# Patient Record
Sex: Male | Born: 1970 | Race: White | Hispanic: No | Smoking: Current every day smoker
Health system: Southern US, Community
[De-identification: ages and names within clinical notes are randomized; demographics above are authoritative.]

## PROBLEM LIST (undated history)

## (undated) DIAGNOSIS — K219 Gastro-esophageal reflux disease without esophagitis: Secondary | ICD-10-CM

## (undated) DIAGNOSIS — K4090 Unilateral inguinal hernia, without obstruction or gangrene, not specified as recurrent: Secondary | ICD-10-CM

## (undated) HISTORY — DX: Unilateral inguinal hernia, without obstruction or gangrene, not specified as recurrent: K40.90

---

## 2002-01-02 HISTORY — PX: ABDOMINAL SURGERY: SHX537

## 2004-01-11 ENCOUNTER — Emergency Department (HOSPITAL_COMMUNITY): Admission: EM | Admit: 2004-01-11 | Discharge: 2004-01-11 | Payer: Self-pay | Admitting: Emergency Medicine

## 2006-02-05 ENCOUNTER — Inpatient Hospital Stay (HOSPITAL_COMMUNITY): Admission: AC | Admit: 2006-02-05 | Discharge: 2006-02-10 | Payer: Self-pay

## 2006-02-18 ENCOUNTER — Emergency Department (HOSPITAL_COMMUNITY): Admission: EM | Admit: 2006-02-18 | Discharge: 2006-02-18 | Payer: Self-pay | Admitting: Emergency Medicine

## 2007-12-15 ENCOUNTER — Emergency Department (HOSPITAL_COMMUNITY): Admission: EM | Admit: 2007-12-15 | Discharge: 2007-12-15 | Payer: Self-pay | Admitting: Family Medicine

## 2008-01-27 ENCOUNTER — Emergency Department: Payer: Self-pay | Admitting: Emergency Medicine

## 2008-03-09 ENCOUNTER — Emergency Department: Payer: Self-pay | Admitting: Emergency Medicine

## 2008-09-29 ENCOUNTER — Emergency Department: Payer: Self-pay | Admitting: Emergency Medicine

## 2009-02-01 ENCOUNTER — Emergency Department (HOSPITAL_COMMUNITY): Admission: EM | Admit: 2009-02-01 | Discharge: 2009-02-01 | Payer: Self-pay | Admitting: Emergency Medicine

## 2009-04-03 ENCOUNTER — Emergency Department (HOSPITAL_COMMUNITY): Admission: EM | Admit: 2009-04-03 | Discharge: 2009-04-03 | Payer: Self-pay | Admitting: Emergency Medicine

## 2013-01-02 DIAGNOSIS — K4090 Unilateral inguinal hernia, without obstruction or gangrene, not specified as recurrent: Secondary | ICD-10-CM

## 2013-01-02 HISTORY — DX: Unilateral inguinal hernia, without obstruction or gangrene, not specified as recurrent: K40.90

## 2013-04-01 ENCOUNTER — Encounter: Payer: Self-pay | Admitting: General Surgery

## 2013-04-17 ENCOUNTER — Other Ambulatory Visit: Payer: Self-pay | Admitting: General Surgery

## 2013-04-17 ENCOUNTER — Ambulatory Visit (INDEPENDENT_AMBULATORY_CARE_PROVIDER_SITE_OTHER): Payer: BC Managed Care – PPO | Admitting: General Surgery

## 2013-04-17 ENCOUNTER — Encounter: Payer: Self-pay | Admitting: General Surgery

## 2013-04-17 VITALS — BP 128/78 | HR 60 | Resp 12 | Ht 74.0 in | Wt 154.0 lb

## 2013-04-17 DIAGNOSIS — K4090 Unilateral inguinal hernia, without obstruction or gangrene, not specified as recurrent: Secondary | ICD-10-CM
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
## 2013-04-17 NOTE — Patient Instructions (Addendum)

## 2013-05-13 ENCOUNTER — Ambulatory Visit: Payer: Self-pay | Admitting: General Surgery

## 2013-05-14 ENCOUNTER — Encounter: Payer: Self-pay | Admitting: General Surgery

## 2013-05-16 ENCOUNTER — Ambulatory Visit: Payer: Self-pay | Admitting: General Surgery

## 2013-05-16 ENCOUNTER — Encounter: Payer: Self-pay | Admitting: General Surgery

## 2013-05-16 DIAGNOSIS — K4090 Unilateral inguinal hernia, without obstruction or gangrene, not specified as recurrent: Secondary | ICD-10-CM

## 2013-05-16 HISTORY — PX: HERNIA REPAIR: SHX51

## 2013-05-19 ENCOUNTER — Telehealth: Payer: Self-pay | Admitting: *Deleted

## 2013-05-19 NOTE — Telephone Encounter (Signed)
New RX for Norco, 5/ 425, #30 w/ No refills provided.

## 2013-05-19 NOTE — Telephone Encounter (Signed)
New RX written. Family will have to pick up as it cannot be called to his pharmacy.

## 2013-05-19 NOTE — Telephone Encounter (Signed)
Pt had hernia repair on 05/16/13 and he is still in pain- you prescribed him Hydrocodone and he has 6 left, he wants to know what can he do when he runs out of the medicine, he was wondering if he could get a few more or take something else for pain.

## 2013-05-28 ENCOUNTER — Ambulatory Visit (INDEPENDENT_AMBULATORY_CARE_PROVIDER_SITE_OTHER): Payer: BC Managed Care – PPO | Admitting: General Surgery

## 2013-05-28 ENCOUNTER — Encounter: Payer: Self-pay | Admitting: General Surgery

## 2013-05-28 VITALS — BP 110/70 | HR 84 | Resp 14 | Ht 74.0 in | Wt 151.0 lb

## 2013-05-28 DIAGNOSIS — K4090 Unilateral inguinal hernia, without obstruction or gangrene, not specified as recurrent: Secondary | ICD-10-CM
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
## 2013-05-28 NOTE — Patient Instructions (Signed)
Patient to return in one month. 

## 2013-06-18 ENCOUNTER — Telehealth: Payer: Self-pay | Admitting: *Deleted

## 2013-06-18 NOTE — Telephone Encounter (Signed)
Pt called and had inguinal hernia surgery on 05/16/13 and now has pain in the main vein that goes through scrotum, he was wondering what should he do, does he need to be seen?

## 2013-06-18 NOTE — Telephone Encounter (Signed)
I talked with mom(pt at work and mobile # not available). She is aware for him to try heat and/or ice for comfort. Also try 2 Aleve BID and to call back Monday with status report. Appointment is for 07-01-13, mom agrees she will tell patient.

## 2013-06-24 ENCOUNTER — Telehealth: Payer: Self-pay | Admitting: *Deleted

## 2013-06-24 NOTE — Telephone Encounter (Signed)
States he is using heat in the evenings and the heat does help. States it is constant "squeezing" sensation, right scrotum. Tried Aleve and Advil. Appointment is 07-01-13.

## 2013-06-24 NOTE — Telephone Encounter (Signed)
Notified patient as instructed.   

## 2013-06-24 NOTE — Telephone Encounter (Signed)
Pt called and is still having pain in his scrotum, he describes it as a squeezing feeling and being drawed up.

## 2013-06-24 NOTE — Telephone Encounter (Signed)
Please make sure he is using jockey shorts or another form of scrotal support. Thanks. F/U as scheduled /6/30.

## 2013-07-01 ENCOUNTER — Ambulatory Visit (INDEPENDENT_AMBULATORY_CARE_PROVIDER_SITE_OTHER): Payer: Self-pay | Admitting: General Surgery

## 2013-07-01 ENCOUNTER — Encounter: Payer: Self-pay | Admitting: General Surgery

## 2013-07-01 VITALS — BP 130/78 | HR 72 | Resp 12 | Ht 74.0 in | Wt 154.0 lb

## 2013-07-01 DIAGNOSIS — K4090 Unilateral inguinal hernia, without obstruction or gangrene, not specified as recurrent: Secondary | ICD-10-CM

## 2013-07-01 DIAGNOSIS — K469 Unspecified abdominal hernia without obstruction or gangrene: Secondary | ICD-10-CM | POA: Insufficient documentation

## 2013-07-01 MED ORDER — DOXYCYCLINE HYCLATE 100 MG PO CAPS
100.0000 mg | ORAL_CAPSULE | Freq: Two times a day (BID) | ORAL | Status: DC
Start: 2013-07-01 — End: 2013-07-22
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
## 2013-07-01 NOTE — Patient Instructions (Signed)
The patient is aware to call back for any questions or concerns.  

## 2013-07-22 ENCOUNTER — Ambulatory Visit (INDEPENDENT_AMBULATORY_CARE_PROVIDER_SITE_OTHER): Payer: BC Managed Care – PPO | Admitting: General Surgery

## 2013-07-22 ENCOUNTER — Other Ambulatory Visit: Payer: BC Managed Care – PPO

## 2013-07-22 ENCOUNTER — Encounter: Payer: Self-pay | Admitting: General Surgery

## 2013-07-22 VITALS — BP 104/74 | HR 76 | Resp 12 | Ht 74.0 in | Wt 153.0 lb

## 2013-07-22 DIAGNOSIS — K4190 Unilateral femoral hernia, without obstruction or gangrene, not specified as recurrent: Secondary | ICD-10-CM

## 2013-07-22 DIAGNOSIS — R1031 Right lower quadrant pain: Secondary | ICD-10-CM

## 2013-07-22 NOTE — Patient Instructions (Signed)
The patient is aware to call back for any questions or concerns.  
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
## 2013-07-23 ENCOUNTER — Encounter: Payer: Self-pay | Admitting: *Deleted

## 2013-07-23 NOTE — Progress Notes (Signed)
Patient ID: [name], male   DOB: 07/13/1970, 43 y.o.   MRN: [id number]  Patient's surgery has been scheduled for 08-14-13 at [facility].

## 2013-08-05 ENCOUNTER — Other Ambulatory Visit: Payer: Self-pay | Admitting: General Surgery

## 2013-08-05 DIAGNOSIS — R1031 Right lower quadrant pain: Secondary | ICD-10-CM

## 2013-08-05 DIAGNOSIS — R1030 Lower abdominal pain, unspecified: Secondary | ICD-10-CM | POA: Insufficient documentation

## 2013-08-05 DIAGNOSIS — K4190 Unilateral femoral hernia, without obstruction or gangrene, not specified as recurrent: Secondary | ICD-10-CM

## 2013-08-14 ENCOUNTER — Ambulatory Visit: Payer: Self-pay | Admitting: General Surgery

## 2013-08-14 DIAGNOSIS — K4190 Unilateral femoral hernia, without obstruction or gangrene, not specified as recurrent: Secondary | ICD-10-CM

## 2013-08-14 HISTORY — PX: HERNIA REPAIR: SHX51

## 2013-08-18 ENCOUNTER — Telehealth: Payer: Self-pay | Admitting: *Deleted

## 2013-08-18 ENCOUNTER — Encounter: Payer: Self-pay | Admitting: General Surgery

## 2013-08-18 NOTE — Progress Notes (Signed)
Patient ID: Terrence DupontKenneth E Okun Jr., male   DOB: 02/01/1970, 43 y.o.   MRN: [id number]  Patient with marked pain after repair of second right groin hernia (femoral on second surgery, inguinal on first). Ilio-inguinal nerve not identified in OR. Will renew Percocet (#30, one po q4h prn pain, no refill) and start Neurontin, 300 mg tab; 1 po qd x 1, 1 po BID x 1 then 1 po TID.

## 2013-08-18 NOTE — Telephone Encounter (Signed)
Patient called the office to state that he only has 6 or 7 pain pills left and needs a refill. He is taking oxycodone 5-325 mg one or two every 4 hours for pain. He had hernia repair completed on 08-14-13. Patient scheduled for follow up appointment on 08-25-13.  The patient also states that you were to clip a nerve at time of surgery and that the area should feel numb. Patient reports the area is not numb and wants to know if this is because he is only 4 days post op.  Patient also states that the area is swollen and wants to know if this is normal. Patient reassured that it is.

## 2013-08-19 ENCOUNTER — Encounter: Payer: Self-pay | Admitting: General Surgery

## 2013-08-25 ENCOUNTER — Ambulatory Visit (INDEPENDENT_AMBULATORY_CARE_PROVIDER_SITE_OTHER): Payer: Self-pay | Admitting: General Surgery

## 2013-08-25 ENCOUNTER — Encounter: Payer: Self-pay | Admitting: General Surgery

## 2013-08-25 ENCOUNTER — Ambulatory Visit: Payer: BC Managed Care – PPO | Admitting: General Surgery

## 2013-08-25 VITALS — BP 100/74 | HR 80 | Resp 12 | Ht 74.0 in | Wt 149.0 lb

## 2013-08-25 DIAGNOSIS — K4190 Unilateral femoral hernia, without obstruction or gangrene, not specified as recurrent: Secondary | ICD-10-CM

## 2013-08-25 NOTE — Patient Instructions (Signed)
Patient advised of lifting restrictions. Patient to return in 2 weeks for follow up. The patient is aware to call back for any questions or concerns.
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
## 2013-08-28 ENCOUNTER — Telehealth: Payer: Self-pay | Admitting: *Deleted

## 2013-08-28 NOTE — Telephone Encounter (Signed)
Pt called and was wondering if he could go back to work on Monday on light duty with no lifting. Had hernia surgery on 08/14/13

## 2013-09-01 NOTE — Telephone Encounter (Signed)
OK to return to work, no heavy lifting.

## 2013-09-02 ENCOUNTER — Encounter: Payer: Self-pay | Admitting: General Surgery

## 2013-09-02 ENCOUNTER — Ambulatory Visit (INDEPENDENT_AMBULATORY_CARE_PROVIDER_SITE_OTHER): Payer: Self-pay | Admitting: General Surgery

## 2013-09-02 VITALS — BP 120/74 | HR 76 | Resp 12 | Ht 74.0 in | Wt 149.0 lb

## 2013-09-02 DIAGNOSIS — N508 Other specified disorders of male genital organs: Secondary | ICD-10-CM

## 2013-09-02 DIAGNOSIS — N5089 Other specified disorders of the male genital organs: Secondary | ICD-10-CM

## 2013-09-02 DIAGNOSIS — K4190 Unilateral femoral hernia, without obstruction or gangrene, not specified as recurrent: Secondary | ICD-10-CM

## 2013-09-02 NOTE — Patient Instructions (Signed)
Patient to return to work on 09/03/13 with light duty . Return in one month.
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
## 2013-09-02 NOTE — Telephone Encounter (Signed)
appt 09-02-13.

## 2013-09-03 DIAGNOSIS — N5089 Other specified disorders of the male genital organs: Secondary | ICD-10-CM | POA: Insufficient documentation

## 2013-09-10 ENCOUNTER — Ambulatory Visit: Payer: BC Managed Care – PPO | Admitting: General Surgery

## 2013-10-07 ENCOUNTER — Encounter: Payer: Self-pay | Admitting: General Surgery

## 2013-10-07 ENCOUNTER — Ambulatory Visit (INDEPENDENT_AMBULATORY_CARE_PROVIDER_SITE_OTHER): Payer: Self-pay | Admitting: General Surgery

## 2013-10-07 VITALS — BP 112/74 | HR 78 | Resp 12 | Ht 74.0 in | Wt 163.0 lb

## 2013-10-07 DIAGNOSIS — K4090 Unilateral inguinal hernia, without obstruction or gangrene, not specified as recurrent: Secondary | ICD-10-CM

## 2013-10-07 DIAGNOSIS — K4190 Unilateral femoral hernia, without obstruction or gangrene, not specified as recurrent: Secondary | ICD-10-CM
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
## 2013-10-07 NOTE — Patient Instructions (Addendum)
Patient may take two  Aleve twice  times daily. Patient to call in one month. Patient may use hot or ice as needed.

## 2013-10-08 ENCOUNTER — Encounter: Payer: Self-pay | Admitting: General Surgery

## 2014-04-25 NOTE — Op Note (Signed)
PATIENT NAME:  Ross Lyons, Ross E MR#:  [id number] DATE OF BIRTH:  Jun 01, 1970  DATE OF PROCEDURE:  05/16/2013  PREOPERATIVE DIAGNOSIS:  Right inguinal hernia.   POSTOPERATIVE DIAGNOSIS:  Right inguinal hernia, direct.   OPERATIVE PROCEDURE:  Right inguinal hernia repair with Phasix plug and patch.    SURGEON:  Donnalee CurryJeffrey Bion Todorov, M.D.   ANESTHESIA:  General endotracheal under Dr. Noralyn Pickarroll.   ESTIMATED BLOOD LOSS:  Less than 5 mL.   CLINICAL NOTE:  This 44 year old male has developed a symptomatic right inguinal hernia. He was admitted for elective repair. He received Kefzol prior to the procedure. Hair had been removed from the surgical site prior to presentation to the operating room.   PROCEDURE IN DETAIL:  With the patient under adequate general endotracheal anesthesia, the abdomen was prepped with ChloraPrep and draped. Marcaine was infiltrated for a field block anesthesia making use of 30 mL of 0.5% Marcaine with 1:200,000 units of epinephrine. A 5 cm skin line incision along the course of the inguinal canal was carried down through the skin and scant layer of adipose tissue. Hemostasis was with electrocautery and 3-0 Vicryl ties. The external oblique was opened in the direction of its fibers. The cord was mobilized. Dissection at the level of the internal ring showed no evidence of an indirect defect. There was marked weakening of the medial floor of the canal consistent with a direct hernia. The area below the transverse abdominis aponeurosis fascia was cleared and a large Phasix plug was placed and stitched circumferentially with 2-0 Monocryl. The external component of absorbable mesh was then placed along the floor of the inguinal canal. This was anchored to the pubic tubercle with similar 2-0 Monocryl and then interrupted sutures of Monocryl or 0 Surgilon to anchor the mesh into place. Anchoring was completed to the inguinal ligament and to the transverse abdominis aponeurosis. A lateral slit was  made for cord passage. The external oblique was closed with a running 2-0 Vicryl after instillation of 30 mg of Toradol. Scarpa fascia was closed with running 3-0 Vicryl and the skin closed with running 4-0 Vicryl subcuticular suture. Benzoin, Steri-Strips, Telfa and Tegaderm dressings were then applied.   The patient tolerated the procedure well and was taken to the recovery room in stable condition.     ____________________________ Earline MayotteJeffrey W. Geremiah Fussell, MD jwb:dmm D: 05/16/2013 08:49:35 ET T: 05/16/2013 10:12:40 ET JOB#: [id number]  cc: Earline MayotteJeffrey W. Trevor Wilkie, MD, <Dictator> Demetrios Isaacsonald E. Sherrie MustacheFisher, MD Luma Clopper Brion AlimentW Yaron Grasse MD ELECTRONICALLY SIGNED 05/18/2013 11:06

## 2014-04-25 NOTE — Op Note (Signed)
PATIENT NAME:  Ross Lyons, Ross Lyons MR#:  [id number] DATE OF BIRTH:  [birth year]  DATE OF PROCEDURE:  08/14/2013  PREOPERATIVE DIAGNOSES: Right femoral hernia, right testicular pain.    POSTOPERATIVE DIAGNOSES: Right femoral hernia, right testicular pain.   OPERATIVE PROCEDURE: Repair of right femoral hernia, exploration of right groin.   SURGEON: Dr. Donnalee CurryJeffrey Byrnett.   ANESTHESIA: General by LMA under Dr. Noralyn Pickarroll, Marcaine 0.5% with 1:200,000 units epinephrine, Toradol 30 mg.   CLINICAL NOTE: This 44 year old male underwent repair of a right direct inguinal hernia 3 months ago. He has had pain in the area of the superior aspect of the testicle and in the last few weeks has developed a bulge inferior to the inguinal ligament. Ultrasound confirmed a femoral hernia. He is planned for repair of his femoral hernia and possible ligation of the ilioinguinal nerve for control of his testicular pain.   OPERATIVE NOTE: With the patient under adequate general anesthesia, the groin was prepped with ChloraPrep and draped. The old incision was opened and extending slightly 1 cm laterally. The skin was incised sharply and the remaining dissection completed with electrocautery. There was noted to be dense scarring at the level of the external oblique. The external oblique was cleared below the inguinal ligament and the area medial to the femoral vein exposed and a 1 cm fascial defect at the site of the femoral hernia was exposed. A medium Atrium plug was planned to be used and was a little large for the defect. Two wedge shaped wings were removed from the outer surface of the plug and then it easily was intrduced  into the defect. It was anchored into position with 0 Surgilon sutures from the deep surface of the pubis to the inguinal ligament.   Attention was then turned towards the groin and the external oblique was opened lateral to the original incision. Fairly dense scarring was evident here. The dissection was  carried laterally just outside the area of the anterior/superior iliac spine. The ilioinguinal nerve and the iliohypogastric nerve could not be identified. It was elected not to disrupt the originally placed repair from his direct hernia. Of note, there was a nerve swinging around the protruding femoral hernia site running transversely. This nerve was preserved during the femoral hernia repair.   The external oblique was closed with a running 2-0 Vicryl. Scarpa's fascia was closed with a running 3-0 Vicryl, and the skin closed with a running 4-0 Vicryl subcuticular suture. Benzoin, Steri-Strips, Telfa and Tegaderm dressings were then applied.   The patient tolerated the procedure well and was taken to the recovery room in stable condition.    ____________________________ Earline MayotteJeffrey W. Byrnett, MD jwb:JT D: 08/14/2013 22:22:44 ET T: 08/14/2013 23:12:28 ET JOB#: [id number]  cc: Earline MayotteJeffrey W. Byrnett, MD, <Dictator> [name]. Sherrie MustacheFisher, MD JEFFREY Brion AlimentW BYRNETT MD ELECTRONICALLY SIGNED 08/15/2013 16:37

## 2016-10-10 ENCOUNTER — Ambulatory Visit: Payer: Self-pay | Admitting: Family Medicine

## 2016-10-13 ENCOUNTER — Encounter: Payer: Self-pay | Admitting: *Deleted

## 2016-10-13 ENCOUNTER — Ambulatory Visit: Payer: Self-pay | Admitting: Family Medicine

## 2016-10-18 ENCOUNTER — Inpatient Hospital Stay: Payer: Self-pay

## 2016-10-18 ENCOUNTER — Ambulatory Visit (INDEPENDENT_AMBULATORY_CARE_PROVIDER_SITE_OTHER): Payer: BLUE CROSS/BLUE SHIELD | Admitting: General Surgery

## 2016-10-18 ENCOUNTER — Encounter: Payer: Self-pay | Admitting: General Surgery

## 2016-10-18 VITALS — BP 100/68 | HR 74 | Resp 11 | Ht 73.0 in | Wt 140.0 lb

## 2016-10-18 DIAGNOSIS — K4191 Unilateral femoral hernia, without obstruction or gangrene, recurrent: Secondary | ICD-10-CM | POA: Diagnosis not present

## 2016-10-18 DIAGNOSIS — R1909 Other intra-abdominal and pelvic swelling, mass and lump: Secondary | ICD-10-CM
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
## 2016-10-18 NOTE — Patient Instructions (Signed)
The patient is aware to call back for any questions or concerns.  Inguinal Hernia, Adult An inguinal hernia is when fat or the intestines push through the area where the leg meets the lower belly (groin) and make a rounded lump (bulge). This condition happens over time. There are three types of inguinal hernias. These types include:  Hernias that can be pushed back into the belly (are reducible).  Hernias that cannot be pushed back into the belly (are incarcerated).  Hernias that cannot be pushed back into the belly and lose their blood supply (get strangulated). This type needs emergency surgery.  Follow these instructions at home: Lifestyle  Drink enough fluid to keep your urine (pee) clear or pale yellow.  Eat plenty of fruits, vegetables, and whole grains. These have a lot of fiber. Talk with your doctor if you have questions.  Avoid lifting heavy objects.  Avoid standing for long periods of time.  Do not use tobacco products. These include cigarettes, chewing tobacco, or e-cigarettes. If you need help quitting, ask your doctor.  Try to stay at a healthy weight. General instructions  Do not try to force the hernia back in.  Watch your hernia for any changes in color or size. Let your doctor know if there are any changes.  Take over-the-counter and prescription medicines only as told by your doctor.  Keep all follow-up visits as told by your doctor. This is important. Contact a doctor if:  You have a fever.  You have new symptoms.  Your symptoms get worse. Get help right away if:  The area where the legs meets the lower belly has: ? Pain that gets worse suddenly. ? A bulge that gets bigger suddenly and does not go down. ? A bulge that turns red or purple. ? A bulge that is painful to the touch.  You are a man and your scrotum: ? Suddenly feels painful. ? Suddenly changes in size.  You feel sick to your stomach (nauseous) and this feeling does not go  away.  You throw up (vomit) and this keeps happening.  You feel your heart beating a lot more quickly than normal.  You cannot poop (have a bowel movement) or pass gas. This information is not intended to replace advice given to you by your health care provider. Make sure you discuss any questions you have with your health care provider. Document Released: 01/19/2006 Document Revised: 05/27/2015 Document Reviewed: 10/29/2013 Elsevier Interactive Patient Education  2018 [employer] Inc[employer]  

## 2016-11-01 ENCOUNTER — Encounter
Admission: RE | Admit: 2016-11-01 | Discharge: 2016-11-01 | Disposition: A | Discharge status: Home / Self Care | Payer: Self-pay | Source: Ambulatory Visit | Attending: General Surgery | Admitting: General Surgery

## 2016-11-01 HISTORY — DX: Gastro-esophageal reflux disease without esophagitis: K21.9

## 2016-11-01 NOTE — Patient Instructions (Signed)
Your procedure is scheduled on: 11-08-16 Report to Same Day Surgery 2nd floor medical mall [facility](Medical Mall Entrance-take elevator on left to 2nd floor.  Check in with surgery information desk.) To find out your arrival time please call [phone] between 1PM - 3PM on 11-07-16  Remember: Instructions that are not followed completely may result in serious medical risk, up to and including death, or upon the discretion of your surgeon and anesthesiologist your surgery may need to be rescheduled.    _x___ 1. Do not eat food after midnight the night before your procedure. You may drink clear liquids up to 2 hours before you are scheduled to arrive at the hospital for your procedure.  Do not drink clear liquids within 2 hours of your scheduled arrival to the hospital.  Clear liquids include  --Water or Apple juice without pulp  --Clear carbohydrate beverage such as ClearFast or Gatorade  --[place] Coffee or Clear Tea (No milk, no creamers, do not add anything to  the coffee or Tea Type 1 and type 2 diabetics should only drink water.  No gum chewing or hard candies.     __x__ 2. No Alcohol for 24 hours before or after surgery.   __x__3. No Smoking for 24 prior to surgery.   ____  4. Bring all medications with you on the day of surgery if instructed.    __x__ 5. Notify your doctor if there is any change in your medical condition     (cold, fever, infections).     Do not wear jewelry, make-up, hairpins, clips or nail polish.  Do not wear lotions, powders, or perfumes. You may wear deodorant.  Do not shave 48 hours prior to surgery. Men may shave face and neck.  Do not bring valuables to the hospital.    [facility] any belongings or valuables.               Contacts, dentures or bridgework may not be worn into surgery.  Leave your suitcase in the car. After surgery it may be brought to your room.  For patients admitted to the hospital, discharge time is determined by  your treatment team.   Patients discharged the day of surgery will not be allowed to drive home.  You will need someone to drive you home and stay with you the night of your procedure.    Please read over the following fact sheets that you were given:     ____ Take anti-hypertensive listed below, cardiac, seizure, asthma,     anti-reflux and psychiatric medicines. These include:  1.NONE   2.  3.  4.  5.  6.  ____Fleets enema or Magnesium Citrate as directed.   ____ Use CHG Soap or sage wipes as directed on instruction sheet   ____ Use inhalers on the day of surgery and bring to hospital day of surgery  ____ Stop Metformin and Janumet 2 days prior to surgery.    ____ Take 1/2 of usual insulin dose the night before surgery and none on the morning     surgery.   ____ Follow recommendations from Cardiologist, Pulmonologist or PCP regarding          stopping Aspirin, Coumadin, Plavix ,Eliquis, Effient, or Pradaxa, and Pletal.  X____Stop Anti-inflammatories such as Advil, Aleve, Ibuprofen, Motrin, Naproxen, Naprosyn, Goodies powders, EXCEDRIN MIGRAINE NOW or aspirin products NOW-OK toCONTINUE ALEVE IF NEEDED   ____ Stop supplements until after surgery.    ____ Bring C-Pap  to the hospital.

## 2016-11-08 ENCOUNTER — Encounter: Payer: Self-pay | Admitting: *Deleted

## 2016-11-08 ENCOUNTER — Ambulatory Visit: Payer: Self-pay | Admitting: Anesthesiology

## 2016-11-08 ENCOUNTER — Encounter
Admission: RE | Disposition: A | Discharge status: Home / Self Care | Payer: Self-pay | Source: Ambulatory Visit | Attending: General Surgery

## 2016-11-08 ENCOUNTER — Ambulatory Visit
Admission: RE | Admit: 2016-11-08 | Discharge: 2016-11-08 | Disposition: A | Discharge status: Home / Self Care | Payer: Self-pay | Source: Ambulatory Visit | Attending: General Surgery | Admitting: General Surgery

## 2016-11-08 DIAGNOSIS — K4191 Unilateral femoral hernia, without obstruction or gangrene, recurrent: Secondary | ICD-10-CM | POA: Diagnosis not present

## 2016-11-08 DIAGNOSIS — F17210 Nicotine dependence, cigarettes, uncomplicated: Secondary | ICD-10-CM | POA: Insufficient documentation

## 2016-11-08 DIAGNOSIS — Z7982 Long term (current) use of aspirin: Secondary | ICD-10-CM | POA: Insufficient documentation

## 2016-11-08 HISTORY — PX: FEMORAL HERNIA REPAIR: SHX632

## 2016-11-08 SURGERY — HERNIA REPAIR FEMORAL
Anesthesia: General | Laterality: Right | Wound class: Clean

## 2016-11-08 MED ORDER — ACETAMINOPHEN 10 MG/ML IV SOLN
INTRAVENOUS | Status: AC
  Filled 2016-11-08: qty 100

## 2016-11-08 MED ORDER — KETOROLAC TROMETHAMINE 30 MG/ML IJ SOLN
INTRAMUSCULAR | Status: DC | PRN
  Administered 2016-11-08: 30 mg via INTRAVENOUS

## 2016-11-08 MED ORDER — FENTANYL CITRATE (PF) 100 MCG/2ML IJ SOLN
25.0000 ug | INTRAMUSCULAR | Status: AC | PRN
  Administered 2016-11-08 (×6): 25 ug via INTRAVENOUS

## 2016-11-08 MED ORDER — PROPOFOL 10 MG/ML IV BOLUS
INTRAVENOUS | Status: DC | PRN
  Administered 2016-11-08: 200 mg via INTRAVENOUS

## 2016-11-08 MED ORDER — GLYCOPYRROLATE 0.2 MG/ML IJ SOLN
INTRAMUSCULAR | Status: DC | PRN
  Administered 2016-11-08: 0.2 mg via INTRAVENOUS

## 2016-11-08 MED ORDER — HYDROCODONE-ACETAMINOPHEN 5-325 MG PO TABS
ORAL_TABLET | ORAL | Status: AC
  Filled 2016-11-08: qty 1

## 2016-11-08 MED ORDER — HYDROMORPHONE HCL 1 MG/ML IJ SOLN
0.5000 mg | INTRAMUSCULAR | Status: AC | PRN
  Administered 2016-11-08 (×4): 0.5 mg via INTRAVENOUS

## 2016-11-08 MED ORDER — FAMOTIDINE 20 MG PO TABS
20.0000 mg | ORAL_TABLET | Freq: Once | ORAL | Status: AC
  Administered 2016-11-08: 20 mg via ORAL

## 2016-11-08 MED ORDER — ACETAMINOPHEN 10 MG/ML IV SOLN
INTRAVENOUS | Status: DC | PRN
  Administered 2016-11-08: 1000 mg via INTRAVENOUS

## 2016-11-08 MED ORDER — MIDAZOLAM HCL 2 MG/2ML IJ SOLN
INTRAMUSCULAR | Status: AC
  Filled 2016-11-08: qty 2

## 2016-11-08 MED ORDER — HYDROCODONE-ACETAMINOPHEN 5-325 MG PO TABS
1.0000 | ORAL_TABLET | ORAL | Status: DC | PRN
  Administered 2016-11-08 (×2): 1 via ORAL

## 2016-11-08 MED ORDER — ONDANSETRON HCL 4 MG/2ML IJ SOLN
INTRAMUSCULAR | Status: AC
  Filled 2016-11-08: qty 2

## 2016-11-08 MED ORDER — DEXAMETHASONE SODIUM PHOSPHATE 10 MG/ML IJ SOLN
INTRAMUSCULAR | Status: AC
  Filled 2016-11-08: qty 1

## 2016-11-08 MED ORDER — HYDROMORPHONE HCL 1 MG/ML IJ SOLN
INTRAMUSCULAR | Status: AC
  Administered 2016-11-08: 0.5 mg via INTRAVENOUS
  Filled 2016-11-08: qty 1

## 2016-11-08 MED ORDER — FENTANYL CITRATE (PF) 100 MCG/2ML IJ SOLN
INTRAMUSCULAR | Status: AC
  Administered 2016-11-08: 25 ug via INTRAVENOUS
  Filled 2016-11-08: qty 2

## 2016-11-08 MED ORDER — CEFAZOLIN SODIUM-DEXTROSE 2-4 GM/100ML-% IV SOLN
INTRAVENOUS | Status: AC
  Filled 2016-11-08: qty 100

## 2016-11-08 MED ORDER — LIDOCAINE HCL (PF) 2 % IJ SOLN
INTRAMUSCULAR | Status: AC
  Filled 2016-11-08: qty 10

## 2016-11-08 MED ORDER — FENTANYL CITRATE (PF) 100 MCG/2ML IJ SOLN
INTRAMUSCULAR | Status: DC | PRN
  Administered 2016-11-08: 25 ug via INTRAVENOUS
  Administered 2016-11-08: 50 ug via INTRAVENOUS
  Administered 2016-11-08: 25 ug via INTRAVENOUS

## 2016-11-08 MED ORDER — ONDANSETRON HCL 4 MG/2ML IJ SOLN: 4.0000 mg | Freq: Once | INTRAMUSCULAR | Status: DC | PRN

## 2016-11-08 MED ORDER — FAMOTIDINE 20 MG PO TABS
ORAL_TABLET | ORAL | Status: AC
  Filled 2016-11-08: qty 1

## 2016-11-08 MED ORDER — HYDROCODONE-ACETAMINOPHEN 5-325 MG PO TABS: 1.0000 | ORAL_TABLET | ORAL | 0 refills | Status: DC | PRN

## 2016-11-08 MED ORDER — BUPIVACAINE-EPINEPHRINE (PF) 0.5% -1:200000 IJ SOLN
INTRAMUSCULAR | Status: AC
  Filled 2016-11-08: qty 30

## 2016-11-08 MED ORDER — PHENYLEPHRINE HCL 10 MG/ML IJ SOLN
INTRAMUSCULAR | Status: DC | PRN
  Administered 2016-11-08: 50 ug via INTRAVENOUS
  Administered 2016-11-08 (×5): 100 ug via INTRAVENOUS

## 2016-11-08 MED ORDER — LACTATED RINGERS IV SOLN
INTRAVENOUS | Status: DC
  Administered 2016-11-08 (×2): via INTRAVENOUS

## 2016-11-08 MED ORDER — BUPIVACAINE-EPINEPHRINE (PF) 0.5% -1:200000 IJ SOLN
INTRAMUSCULAR | Status: DC | PRN
  Administered 2016-11-08 (×2): 10 mL via PERINEURAL

## 2016-11-08 MED ORDER — DEXAMETHASONE SODIUM PHOSPHATE 10 MG/ML IJ SOLN
INTRAMUSCULAR | Status: DC | PRN
  Administered 2016-11-08: 10 mg via INTRAVENOUS

## 2016-11-08 MED ORDER — FENTANYL CITRATE (PF) 100 MCG/2ML IJ SOLN
INTRAMUSCULAR | Status: AC
  Filled 2016-11-08: qty 2

## 2016-11-08 MED ORDER — KETOROLAC TROMETHAMINE 30 MG/ML IJ SOLN
INTRAMUSCULAR | Status: AC
  Filled 2016-11-08: qty 1

## 2016-11-08 MED ORDER — ONDANSETRON HCL 4 MG/2ML IJ SOLN
INTRAMUSCULAR | Status: DC | PRN
  Administered 2016-11-08: 4 mg via INTRAVENOUS

## 2016-11-08 MED ORDER — MIDAZOLAM HCL 2 MG/2ML IJ SOLN
INTRAMUSCULAR | Status: DC | PRN
  Administered 2016-11-08: 2 mg via INTRAVENOUS

## 2016-11-08 MED ORDER — LIDOCAINE HCL (CARDIAC) 20 MG/ML IV SOLN
INTRAVENOUS | Status: DC | PRN
  Administered 2016-11-08: 100 mg via INTRAVENOUS

## 2016-11-08 MED ORDER — PROPOFOL 10 MG/ML IV BOLUS
INTRAVENOUS | Status: AC
  Filled 2016-11-08: qty 20

## 2016-11-08 MED ORDER — CEFAZOLIN SODIUM-DEXTROSE 2-4 GM/100ML-% IV SOLN
2.0000 g | INTRAVENOUS | Status: AC
  Administered 2016-11-08: 2 g via INTRAVENOUS

## 2016-11-08 SURGICAL SUPPLY — 33 items
BLADE SURG 15 STRL SS SAFETY (BLADE) ×6 IMPLANT
CANISTER SUCT 1200ML W/VALVE (MISCELLANEOUS) ×3 IMPLANT
CHLORAPREP W/TINT 26ML (MISCELLANEOUS) ×3 IMPLANT
CLOSURE WOUND 1/2 X4 (GAUZE/BANDAGES/DRESSINGS) ×1
DECANTER SPIKE VIAL GLASS SM (MISCELLANEOUS) ×3 IMPLANT
DRAIN PENROSE 1/4X12 LTX (DRAIN) ×3 IMPLANT
DRAPE LAPAROTOMY 100X77 ABD (DRAPES) ×3 IMPLANT
DRSG TEGADERM 4X4.75 (GAUZE/BANDAGES/DRESSINGS) ×3 IMPLANT
DRSG TELFA 4X3 1S NADH ST (GAUZE/BANDAGES/DRESSINGS) ×3 IMPLANT
ELECT REM PT RETURN 9FT ADLT (ELECTROSURGICAL) ×3
ELECTRODE REM PT RTRN 9FT ADLT (ELECTROSURGICAL) ×1 IMPLANT
GLOVE BIO SURGEON STRL SZ7.5 (GLOVE) ×7 IMPLANT
GLOVE INDICATOR 8.0 STRL GRN (GLOVE) ×5 IMPLANT
GOWN STRL REUS W/ TWL LRG LVL3 (GOWN DISPOSABLE) ×2 IMPLANT
GOWN STRL REUS W/TWL LRG LVL3 (GOWN DISPOSABLE) ×6
KIT RM TURNOVER STRD PROC AR (KITS) ×3 IMPLANT
LABEL OR SOLS (LABEL) ×3 IMPLANT
NDL HYPO 25X1 1.5 SAFETY (NEEDLE) ×1 IMPLANT
NDL SAFETY 22GX1.5 (NEEDLE) ×6 IMPLANT
NEEDLE HYPO 25X1 1.5 SAFETY (NEEDLE) ×3 IMPLANT
PACK BASIN MINOR ARMC (MISCELLANEOUS) ×3 IMPLANT
STRIP CLOSURE SKIN 1/2X4 (GAUZE/BANDAGES/DRESSINGS) ×2 IMPLANT
SUT SURGILON 0 BLK (SUTURE) ×6 IMPLANT
SUT VIC AB 2-0 SH 27 (SUTURE) ×3
SUT VIC AB 2-0 SH 27XBRD (SUTURE) ×1 IMPLANT
SUT VIC AB 3-0 54X BRD REEL (SUTURE) ×1 IMPLANT
SUT VIC AB 3-0 BRD 54 (SUTURE) ×3
SUT VIC AB 3-0 SH 27 (SUTURE) ×3
SUT VIC AB 3-0 SH 27X BRD (SUTURE) ×1 IMPLANT
SUT VIC AB 4-0 FS2 27 (SUTURE) ×3 IMPLANT
SWABSTK COMLB BENZOIN TINCTURE (MISCELLANEOUS) ×3 IMPLANT
SYR 3ML LL SCALE MARK (SYRINGE) ×3 IMPLANT
SYR CONTROL 10ML (SYRINGE) ×6 IMPLANT

## 2016-11-08 NOTE — H&P (Signed)
No change in clinical history or exam. For repair of recurrent right femoral hernia.  Groin exam otherwise normal. No nodularity of the spermatic cord as noted in 2015.

## 2016-11-08 NOTE — Anesthesia Preprocedure Evaluation (Signed)
Anesthesia Evaluation  Patient identified by MRN, date of birth, ID band Patient awake    Reviewed: Allergy & Precautions, NPO status , Patient's Chart, lab work & pertinent test results  History of Anesthesia Complications Negative for: history of anesthetic complications  Airway Mallampati: II       Dental   Pulmonary neg sleep apnea, neg COPD, Current Smoker,           Cardiovascular (-) hypertension(-) CHF (-) dysrhythmias (-) Valvular Problems/Murmurs     Neuro/Psych neg Seizures    GI/Hepatic Neg liver ROS, GERD: hx in past, no problems now.  ,  Endo/Other  neg diabetes  Renal/GU negative Renal ROS     Musculoskeletal   Abdominal   Peds  Hematology   Anesthesia Other Findings   Reproductive/Obstetrics                             Anesthesia Physical Anesthesia Plan  ASA: II  Anesthesia Plan: General   Post-op Pain Management:    Induction:   PONV Risk Score and Plan: 1 and Ondansetron and Midazolam  Airway Management Planned: LMA  Additional Equipment:   Intra-op Plan:   Post-operative Plan:   Informed Consent: I have reviewed the patients History and Physical, chart, labs and discussed the procedure including the risks, benefits and alternatives for the proposed anesthesia with the patient or authorized representative who has indicated his/her understanding and acceptance.     Plan Discussed with:   Anesthesia Plan Comments:         Anesthesia Quick Evaluation

## 2016-11-08 NOTE — Transfer of Care (Signed)
Immediate Anesthesia Transfer of Care Note  Patient: Ross DupontKenneth E Cerda Jr.  Procedure(s) Performed: HERNIA REPAIR FEMORAL (Right )  Patient Location: PACU  Anesthesia Type:General  Level of Consciousness: awake, alert , oriented and patient cooperative  Airway & Oxygen Therapy: Patient Spontanous Breathing  Post-op Assessment: Report given to RN and Post -op Vital signs reviewed and stable  Post vital signs: Reviewed and stable  Last Vitals:  Vitals:   11/08/16 1107 11/08/16 1416  BP: 124/88 134/90  Pulse: 70 78  Resp:  (!) 9  Temp: 36.9 C 36.8 C  SpO2: 100% 100%    Last Pain:  Vitals:   11/08/16 1416  TempSrc: Temporal  PainSc: (P) 6          Complications: No apparent anesthesia complications

## 2016-11-08 NOTE — Op Note (Signed)
Preoperative diagnosis: Recurrent right femoral hernia.  Postoperative diagnosis: Same.  Operative procedure: Repair of recurrent right femoral hernia, partial mesh explantation.  Operating Surgeon: Inocente SallesJeffrey Burnett, MD.  Anesthesia: General by LMA, Marcaine 0.5% with 1-200,000 units of epinephrine, 20 cc.  Estimated blood loss: Less than 2 cc.  Clinical note: This 46 year old male underwent repair of a right femoral hernia in 2015.  A small atrium plug was used.  He is developed a recurrent hernia.  He was admitted for elective repair.  He received Kefzol prior to the procedure.  Hair was removed from the surgical site prior to presentation to the operating theater.  Operative note: With the patient under adequate general anesthesia the area was cleansed with ChloraPrep and draped.  Marcaine was infiltrated for postoperative analgesia.  The infrainguinal incision was carried down to the subcutaneous tissue and hemostasis achieved by electrocautery.  3-0 Vicryl ties were used as indicated.  The abductor fascia was exposed and the hernia sac identified superior and lateral to this.  The sac was excised and discarded.  A small pad of fat was returned to the preperitoneal space.  The mesh was partially mobilized and was found to be very adherent on the lateral aspect near the femoral vein.  The majority was transected and discarded.  It was elected to just complete a primary repair with interrupted 0 Surgilon sutures.  These were placed between the conjoined tendon and the inguinal ligament.  3 sutures were placed individually and then tied sequentially.  Good hemostasis and closure was noted.  The wound was closed in layers with a running 2-0 Vicryl to the adductor fascia and the deep adipose tissue.  The superficial fascia was closed with running 3-0 Vicryl suture and the skin closed with a running 4-0 Vicryl subcuticular suture.  Benzoin and Steri-Strips followed by Telfa and Tegaderm dressing  applied.  The patient tolerated the procedure well and was brought to the recovery in stable condition.

## 2016-11-08 NOTE — OR Nursing (Signed)
Dr. Lemar LivingsByrnett visited.  Ok'd DC Home

## 2016-11-08 NOTE — Anesthesia Post-op Follow-up Note (Signed)
Anesthesia QCDR form completed.        

## 2016-11-08 NOTE — Anesthesia Postprocedure Evaluation (Signed)
Anesthesia Post Note  Patient: Ross DupontKenneth E Nasby Jr.  Procedure(s) Performed: HERNIA REPAIR FEMORAL (Right )  Patient location during evaluation: PACU Anesthesia Type: General Level of consciousness: awake and alert Pain management: pain level controlled Vital Signs Assessment: post-procedure vital signs reviewed and stable Respiratory status: spontaneous breathing and respiratory function stable Cardiovascular status: stable Anesthetic complications: no     Last Vitals:  Vitals:   11/08/16 1446 11/08/16 1501  BP: (!) 134/96 (!) 139/93  Pulse: 68 (!) 56  Resp: 13 (!) 8  Temp:    SpO2: 100% 100%    Last Pain:  Vitals:   11/08/16 1511  TempSrc:   PainSc: 6                  KEPHART,WILLIAM K

## 2016-11-08 NOTE — Anesthesia Procedure Notes (Signed)
Procedure Name: LMA Insertion Date/Time: 11/08/2016 1:15 PM Performed by: Marlana SalvageJessup, Monica Codd, CRNA Pre-anesthesia Checklist: Patient identified, Emergency Drugs available, Suction available, Patient being monitored and Timeout performed Patient Re-evaluated:Patient Re-evaluated prior to induction Oxygen Delivery Method: Circle system utilized Preoxygenation: Pre-oxygenation with 100% oxygen Induction Type: IV induction Ventilation: Mask ventilation without difficulty LMA: LMA inserted LMA Size: 5.0 Number of attempts: 1 Placement Confirmation: positive ETCO2 Tube secured with: Tape Dental Injury: Teeth and Oropharynx as per pre-operative assessment

## 2016-11-08 NOTE — Discharge Instructions (Signed)

## 2016-11-09 ENCOUNTER — Encounter: Payer: Self-pay | Admitting: General Surgery

## 2016-11-10 LAB — SURGICAL PATHOLOGY

## 2016-11-15 ENCOUNTER — Ambulatory Visit (INDEPENDENT_AMBULATORY_CARE_PROVIDER_SITE_OTHER): Payer: BLUE CROSS/BLUE SHIELD | Admitting: General Surgery

## 2016-11-15 ENCOUNTER — Encounter: Payer: Self-pay | Admitting: General Surgery

## 2016-11-15 VITALS — BP 124/68 | HR 62 | Resp 14 | Ht 73.0 in | Wt 146.0 lb

## 2016-11-15 DIAGNOSIS — K4191 Unilateral femoral hernia, without obstruction or gangrene, recurrent: Secondary | ICD-10-CM

## 2016-11-15 MED ORDER — HYDROCODONE-ACETAMINOPHEN 5-325 MG PO TABS: 1.0000 | ORAL_TABLET | ORAL | 0 refills | Status: DC | PRN
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
## 2016-11-15 NOTE — Patient Instructions (Addendum)
The patient is aware to call back for any questions or concerns. Ice or heat as needed for comfort

## 2016-12-07 ENCOUNTER — Ambulatory Visit: Payer: Self-pay | Admitting: General Surgery

## 2016-12-12 ENCOUNTER — Telehealth: Payer: Self-pay | Admitting: *Deleted

## 2016-12-12 ENCOUNTER — Encounter: Payer: Self-pay | Admitting: *Deleted

## 2016-12-12 NOTE — Progress Notes (Signed)
He needed new work note

## 2016-12-12 NOTE — Telephone Encounter (Signed)
Patient called requesting a return back to work note from surgery that he had on 11/08/16 by Dr.Byrnett

## 2016-12-12 NOTE — Telephone Encounter (Signed)
He missed his last f/u appointment. Follow up appointment r/s for 12-27-16. Return to work note printed, pt to pick up.

## 2016-12-27 ENCOUNTER — Encounter: Payer: Self-pay | Admitting: General Surgery

## 2016-12-27 ENCOUNTER — Ambulatory Visit (INDEPENDENT_AMBULATORY_CARE_PROVIDER_SITE_OTHER): Payer: Self-pay | Admitting: General Surgery

## 2016-12-27 VITALS — BP 128/82 | HR 90 | Resp 14 | Ht 73.0 in | Wt 146.0 lb

## 2016-12-27 DIAGNOSIS — K4191 Unilateral femoral hernia, without obstruction or gangrene, recurrent: Secondary | ICD-10-CM
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
## 2016-12-27 NOTE — Patient Instructions (Signed)
The patient is aware to call back for any questions or concerns.  

## 2016-12-28 ENCOUNTER — Encounter: Payer: Self-pay | Admitting: General Surgery

## 2017-11-08 ENCOUNTER — Encounter: Payer: Self-pay | Admitting: Family Medicine

## 2017-12-13 ENCOUNTER — Encounter: Payer: Self-pay | Admitting: Family Medicine

## 2020-03-17 ENCOUNTER — Emergency Department
Admission: EM | Admit: 2020-03-17 | Discharge: 2020-03-17 | Disposition: A | Discharge status: Home / Self Care | Payer: Self-pay | Attending: Student in an Organized Health Care Education/Training Program | Admitting: Student in an Organized Health Care Education/Training Program

## 2020-03-17 ENCOUNTER — Other Ambulatory Visit: Payer: Self-pay

## 2020-03-17 DIAGNOSIS — R0989 Other specified symptoms and signs involving the circulatory and respiratory systems: Secondary | ICD-10-CM | POA: Insufficient documentation

## 2020-03-17 DIAGNOSIS — F17210 Nicotine dependence, cigarettes, uncomplicated: Secondary | ICD-10-CM | POA: Insufficient documentation

## 2020-03-17 DIAGNOSIS — R0602 Shortness of breath: Secondary | ICD-10-CM | POA: Insufficient documentation

## 2020-03-17 DIAGNOSIS — Z20822 Contact with and (suspected) exposure to covid-19: Secondary | ICD-10-CM | POA: Insufficient documentation

## 2020-03-17 DIAGNOSIS — M791 Myalgia, unspecified site: Secondary | ICD-10-CM | POA: Insufficient documentation

## 2020-03-17 LAB — RESP PANEL BY RT-PCR (FLU A&B, COVID) ARPGX2
Influenza A by PCR: NEGATIVE
Influenza B by PCR: NEGATIVE
SARS Coronavirus 2 by RT PCR: NEGATIVE
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
## 2020-03-17 NOTE — ED Triage Notes (Signed)
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 States he wants to make sure he does not have covid

## 2020-03-17 NOTE — Discharge Instructions (Signed)
